# Patient Record
Sex: Male | Born: 1990 | Race: Black or African American | Hispanic: No | Marital: Single | Smoking: Current every day smoker
Health system: Southern US, Community
[De-identification: ages and names within clinical notes are randomized; demographics above are authoritative.]

---
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
## 2012-05-25 ENCOUNTER — Emergency Department (HOSPITAL_COMMUNITY)
Admission: EM | Admit: 2012-05-25 | Discharge: 2012-05-25 | Disposition: A | Discharge status: Home / Self Care | Payer: No Typology Code available for payment source | Attending: Emergency Medicine | Admitting: Emergency Medicine

## 2012-05-25 ENCOUNTER — Encounter (HOSPITAL_COMMUNITY): Payer: Self-pay

## 2012-05-25 DIAGNOSIS — Z87891 Personal history of nicotine dependence: Secondary | ICD-10-CM | POA: Insufficient documentation

## 2012-05-25 DIAGNOSIS — Z79899 Other long term (current) drug therapy: Secondary | ICD-10-CM | POA: Insufficient documentation

## 2012-05-25 DIAGNOSIS — Y9389 Activity, other specified: Secondary | ICD-10-CM | POA: Insufficient documentation

## 2012-05-25 DIAGNOSIS — S79929A Unspecified injury of unspecified thigh, initial encounter: Secondary | ICD-10-CM | POA: Insufficient documentation

## 2012-05-25 DIAGNOSIS — S79919A Unspecified injury of unspecified hip, initial encounter: Secondary | ICD-10-CM | POA: Insufficient documentation

## 2012-05-25 DIAGNOSIS — S0993XA Unspecified injury of face, initial encounter: Secondary | ICD-10-CM | POA: Insufficient documentation

## 2012-05-25 DIAGNOSIS — S199XXA Unspecified injury of neck, initial encounter: Secondary | ICD-10-CM | POA: Insufficient documentation

## 2012-05-25 MED ORDER — TRAMADOL HCL 50 MG PO TABS
50.0000 mg | ORAL_TABLET | Freq: Once | ORAL | Status: AC
  Administered 2012-05-25: 50 mg via ORAL
  Filled 2012-05-25: qty 1

## 2012-05-25 MED ORDER — IBUPROFEN 800 MG PO TABS
800.0000 mg | ORAL_TABLET | Freq: Once | ORAL | Status: AC
  Administered 2012-05-25: 800 mg via ORAL
  Filled 2012-05-25: qty 1

## 2012-05-25 MED ORDER — TRAMADOL HCL 50 MG PO TABS: 50.0000 mg | ORAL_TABLET | Freq: Four times a day (QID) | ORAL | Status: AC | PRN

## 2012-05-25 MED ORDER — DIAZEPAM 5 MG PO TABS
5.0000 mg | ORAL_TABLET | Freq: Once | ORAL | Status: DC
  Filled 2012-05-25: qty 1

## 2012-05-25 MED ORDER — DIAZEPAM 5 MG PO TABS: 5.0000 mg | ORAL_TABLET | Freq: Two times a day (BID) | ORAL | Status: AC

## 2012-05-25 MED ORDER — IBUPROFEN 800 MG PO TABS: 800.0000 mg | ORAL_TABLET | Freq: Three times a day (TID) | ORAL | Status: AC
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
## 2013-06-06 ENCOUNTER — Emergency Department: Payer: Self-pay | Admitting: Emergency Medicine

## 2018-12-02 ENCOUNTER — Other Ambulatory Visit: Payer: Self-pay

## 2018-12-02 ENCOUNTER — Encounter (HOSPITAL_COMMUNITY): Payer: Self-pay | Admitting: Emergency Medicine

## 2018-12-02 ENCOUNTER — Emergency Department (HOSPITAL_COMMUNITY): Payer: 59

## 2018-12-02 ENCOUNTER — Emergency Department (HOSPITAL_COMMUNITY)
Admission: EM | Admit: 2018-12-02 | Discharge: 2018-12-02 | Disposition: A | Discharge status: Home / Self Care | Payer: 59 | Attending: Emergency Medicine | Admitting: Emergency Medicine

## 2018-12-02 DIAGNOSIS — F17210 Nicotine dependence, cigarettes, uncomplicated: Secondary | ICD-10-CM | POA: Diagnosis not present

## 2018-12-02 DIAGNOSIS — Y92410 Unspecified street and highway as the place of occurrence of the external cause: Secondary | ICD-10-CM | POA: Insufficient documentation

## 2018-12-02 DIAGNOSIS — R51 Headache: Secondary | ICD-10-CM | POA: Insufficient documentation

## 2018-12-02 DIAGNOSIS — Y998 Other external cause status: Secondary | ICD-10-CM | POA: Insufficient documentation

## 2018-12-02 DIAGNOSIS — M545 Low back pain: Secondary | ICD-10-CM | POA: Diagnosis not present

## 2018-12-02 DIAGNOSIS — Y9389 Activity, other specified: Secondary | ICD-10-CM | POA: Diagnosis not present

## 2018-12-02 DIAGNOSIS — M542 Cervicalgia: Secondary | ICD-10-CM | POA: Insufficient documentation

## 2018-12-02 DIAGNOSIS — F17290 Nicotine dependence, other tobacco product, uncomplicated: Secondary | ICD-10-CM | POA: Insufficient documentation

## 2018-12-02 MED ORDER — METHOCARBAMOL 500 MG PO TABS: 500.0000 mg | ORAL_TABLET | Freq: Two times a day (BID) | ORAL | 0 refills | Status: AC

## 2018-12-02 MED ORDER — DIPHENHYDRAMINE HCL 25 MG PO CAPS
25.0000 mg | ORAL_CAPSULE | Freq: Once | ORAL | Status: AC
  Administered 2018-12-02: 25 mg via ORAL
  Filled 2018-12-02: qty 1

## 2018-12-02 MED ORDER — NAPROXEN 500 MG PO TABS: 500.0000 mg | ORAL_TABLET | Freq: Two times a day (BID) | ORAL | 0 refills | Status: AC

## 2018-12-02 MED ORDER — METOCLOPRAMIDE HCL 10 MG PO TABS
10.0000 mg | ORAL_TABLET | Freq: Once | ORAL | Status: AC
  Administered 2018-12-02: 10 mg via ORAL
  Filled 2018-12-02: qty 1
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
## 2018-12-02 NOTE — ED Notes (Signed)
Patient transported to X-ray 
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
## 2018-12-02 NOTE — Discharge Instructions (Signed)
I have prescribed muscle relaxers for your pain, please do not drink or drive while taking this medications as they can make you drowsy.  Please follow-up with PCP in 1 week for reevaluation of your symptoms.  You experience any bowel or bladder incontinence, fever, worsening in your symptoms please return to the ED. ° °

## 2020-03-28 ENCOUNTER — Emergency Department (HOSPITAL_COMMUNITY)
Admission: EM | Admit: 2020-03-28 | Discharge: 2020-03-28 | Disposition: A | Discharge status: Home / Self Care | Payer: Commercial Managed Care - PPO | Attending: Emergency Medicine | Admitting: Emergency Medicine

## 2020-03-28 ENCOUNTER — Other Ambulatory Visit: Payer: Self-pay

## 2020-03-28 ENCOUNTER — Emergency Department (HOSPITAL_COMMUNITY): Payer: Commercial Managed Care - PPO

## 2020-03-28 ENCOUNTER — Encounter (HOSPITAL_COMMUNITY): Payer: Self-pay

## 2020-03-28 DIAGNOSIS — R1030 Lower abdominal pain, unspecified: Secondary | ICD-10-CM | POA: Diagnosis present

## 2020-03-28 DIAGNOSIS — F17290 Nicotine dependence, other tobacco product, uncomplicated: Secondary | ICD-10-CM | POA: Insufficient documentation

## 2020-03-28 DIAGNOSIS — K611 Rectal abscess: Secondary | ICD-10-CM | POA: Diagnosis not present

## 2020-03-28 LAB — I-STAT CHEM 8, ED
BUN: 12 mg/dL (ref 6–20)
Calcium, Ion: 1.24 mmol/L (ref 1.15–1.40)
Chloride: 102 mmol/L (ref 98–111)
Creatinine, Ser: 0.8 mg/dL (ref 0.61–1.24)
Glucose, Bld: 92 mg/dL (ref 70–99)
HCT: 49 % (ref 39.0–52.0)
Hemoglobin: 16.7 g/dL (ref 13.0–17.0)
Potassium: 4.3 mmol/L (ref 3.5–5.1)
Sodium: 140 mmol/L (ref 135–145)
TCO2: 27 mmol/L (ref 22–32)

## 2020-03-28 MED ORDER — IOHEXOL 300 MG/ML  SOLN
80.0000 mL | Freq: Once | INTRAMUSCULAR | Status: AC | PRN
  Administered 2020-03-28: 80 mL via INTRAVENOUS

## 2020-03-28 MED ORDER — SODIUM CHLORIDE (PF) 0.9 % IJ SOLN
INTRAMUSCULAR | Status: AC
  Filled 2020-03-28: qty 50

## 2020-03-28 MED ORDER — LIDOCAINE HCL 1 % IJ SOLN
INTRAMUSCULAR | Status: AC
  Filled 2020-03-28: qty 20

## 2020-03-28 MED ORDER — OXYCODONE-ACETAMINOPHEN 5-325 MG PO TABS
1.0000 | ORAL_TABLET | Freq: Once | ORAL | Status: AC
  Administered 2020-03-28: 1 via ORAL
  Filled 2020-03-28: qty 1

## 2020-03-28 MED ORDER — DOXYCYCLINE HYCLATE 100 MG PO CAPS: 100.0000 mg | ORAL_CAPSULE | Freq: Two times a day (BID) | ORAL | 0 refills | Status: AC

## 2020-03-28 NOTE — ED Provider Notes (Signed)
[facility]-EMERGENCY DEPT Provider Note   CSN: [id number] Arrival date & time: 03/28/20  1017     History Chief Complaint  Patient presents with  . Recurrent Skin Infections    Gary Kelley is a 29 y.o. male with no past medical history that presents emergency department today for abscess on his buttock.  Patient states that he started having pain in his intergluteal cleft on Saturday, has been worsening.  States that he did have boil on his inner thigh 4 months ago, was given antibiotics, and I&D and patient states that he felt fine.  States that he completed course of antibiotics.  Denies any fever, chills, nausea, vomiting.  Area has not been draining.  States that he is generally healthy, not immunocompromised.  Has never had pilonidal abscess before.  No history of Crohn's disease.  Patient states that he is unable to sit due to the pain, has not been taking anything for this.  Has been trying warm compresses without much relief.  Denies any straining, blood per rectum, constipation or blood in the stool.  Has never had an abscess in this area.  HPI     History reviewed. No pertinent past medical history.  There are no problems to display for this patient.   History reviewed. No pertinent surgical history.     No family history on file.  Social History   Tobacco Use  . Smoking status: Current Every Day Smoker    Packs/day: 1.00    Types: Pipe, Cigars  . Smokeless tobacco: Never Used  Substance Use Topics  . Alcohol use: Yes    Comment: socially  . Drug use: No    Home Medications Prior to Admission medications   Medication Sig Start Date End Date Taking? Authorizing Provider  diazepam (VALIUM) 5 MG tablet Take 1 tablet (5 mg total) by mouth 2 (two) times daily. 05/25/12   Gerhard Munch, MD  doxycycline (VIBRAMYCIN) 100 MG capsule Take 1 capsule (100 mg total) by mouth 2 (two) times daily for 7 days. 03/28/20 04/04/20  Farrel Gordon,  PA-C  ibuprofen (ADVIL,MOTRIN) 200 MG tablet Take 200 mg by mouth every 6 (six) hours as needed. Pain    [provider]  ibuprofen (ADVIL,MOTRIN) 800 MG tablet Take 1 tablet (800 mg total) by mouth 3 (three) times daily. 05/25/12   Gerhard Munch, MD  traMADol (ULTRAM) 50 MG tablet Take 1 tablet (50 mg total) by mouth every 6 (six) hours as needed for pain. 05/25/12   Gerhard Munch, MD    Allergies    Patient has no known allergies.  Review of Systems   Review of Systems  Constitutional: Negative for diaphoresis, fatigue and fever.  Eyes: Negative for visual disturbance.  Respiratory: Negative for shortness of breath.   Cardiovascular: Negative for chest pain.  Gastrointestinal: Negative for nausea and vomiting.  Genitourinary: Negative for decreased urine volume, discharge, flank pain, frequency, genital sores, hematuria, penile pain, penile swelling, scrotal swelling and testicular pain.  Musculoskeletal: Negative for back pain and myalgias.  Skin: Positive for wound. Negative for color change, pallor and rash.  Neurological: Negative for syncope, weakness, light-headedness, numbness and headaches.  Psychiatric/Behavioral: Negative for behavioral problems and confusion.    Physical Exam Updated Vital Signs BP 120/82   Pulse 70   Temp 98.7 F (37.1 C) (Oral)   Resp 18   Ht 5\' 6"  (1.676 m)   Wt 59 kg   SpO2 99%   BMI 20.98 kg/m  Physical Exam Constitutional:      General: He is not in acute distress.    Appearance: Normal appearance. He is not ill-appearing, toxic-appearing or diaphoretic.  Cardiovascular:     Rate and Rhythm: Normal rate.     Pulses: Normal pulses.  Pulmonary:     Effort: Pulmonary effort is normal.     Breath sounds: Normal breath sounds.  Genitourinary:      Comments: Chaperone present.  Patient with tenderness to intergluteal cleft, is slightly more on the left side than midline.  Fluctuance noted.  Area is warm and erythematous.  Is  not actively draining. Musculoskeletal:        General: Normal range of motion.  Skin:    General: Skin is warm and dry.     Capillary Refill: Capillary refill takes less than 2 seconds.  Neurological:     General: No focal deficit present.     Mental Status: He is alert and oriented to person, place, and time.  Psychiatric:        Mood and Affect: Mood normal.        Behavior: Behavior normal.        Thought Content: Thought content normal.     ED Results / Procedures / Treatments   Labs (all labs ordered are listed, but only abnormal results are displayed) Labs Reviewed  I-STAT CHEM 8, ED    EKG None  Radiology CT PELVIS W CONTRAST  Result Date: 03/28/2020 CLINICAL DATA:  Rectal or anal abscess. EXAM: CT PELVIS WITH CONTRAST TECHNIQUE: Multidetector CT imaging of the pelvis was performed using the standard protocol following the bolus administration of intravenous contrast. CONTRAST:  74mL OMNIPAQUE IOHEXOL 300 MG/ML  SOLN COMPARISON:  None. FINDINGS: Urinary Tract:  No abnormality. Bowel:  No abnormality. Vascular/Lymphatic: No abnormality. Reproductive:  No abnormality. Other: Soft tissue inflammation with a central low-density component measuring 2.7 x 1.8 x 3.0 cm in the subcutaneous fat of the left buttock adjacent to the gluteal fold. No sign of perirectal origin or extension. Musculoskeletal: Otherwise negative IMPRESSION: Soft tissue inflammation with a central low-density component measuring 2.7 x 1.8 x 3.0 cm in the subcutaneous fat of the left buttock adjacent to the gluteal fold. This is consistent with a soft tissue abscess. No sign of perirectal origin or extension. Electronically Signed   By: Paulina Fusi M.D.   On: 03/28/2020 14:06    Procedures .Marland KitchenIncision and Drainage  Date/Time: 03/28/2020 3:24 PM Performed by: Farrel Gordon, PA-C Authorized by: Farrel Gordon, PA-C   Consent:    Consent obtained:  Verbal   Consent given by:  Patient   Risks discussed:   Bleeding, incomplete drainage, pain and damage to other organs   Alternatives discussed:  No treatment Universal protocol:    Procedure explained and questions answered to patient or proxy's satisfaction: yes     Relevant documents present and verified: yes     Test results available and properly labeled: yes     Imaging studies available: yes     Required blood products, implants, devices, and special equipment available: yes     Site/side marked: yes     Immediately prior to procedure a time out was called: yes     Patient identity confirmed:  Verbally with patient Location:    Type:  Abscess   Size:  2x 3cm    Location:  Anogenital   Anogenital location:  Perirectal Pre-procedure details:    Skin preparation:  Betadine Anesthesia (see MAR  for exact dosages):    Anesthesia method:  Local infiltration   Local anesthetic:  Lidocaine 1% w/o epi Procedure type:    Complexity:  Complex Procedure details:    Incision types:  Single straight   Incision depth:  Subcutaneous   Scalpel blade:  11   Wound management:  Probed and deloculated, irrigated with saline and extensive cleaning   Drainage:  Purulent   Drainage amount:  Moderate   Wound treatment:  Wound left open   Packing materials:  1/4 in gauze and 1/2 in gauze Post-procedure details:    Patient tolerance of procedure:  Tolerated well, no immediate complications   (including critical care time)  Medications Ordered in ED Medications  sodium chloride (PF) 0.9 % injection (has no administration in time range)  lidocaine (XYLOCAINE) 1 % (with pres) injection (has no administration in time range)  oxyCODONE-acetaminophen (PERCOCET/ROXICET) 5-325 MG per tablet 1 tablet (1 tablet Oral Given 03/28/20 1246)  iohexol (OMNIPAQUE) 300 MG/ML solution 80 mL (80 mLs Intravenous Contrast Given 03/28/20 1348)  oxyCODONE-acetaminophen (PERCOCET/ROXICET) 5-325 MG per tablet 1 tablet (1 tablet Oral Given 03/28/20 1444)    ED Course  I  have reviewed the triage vital signs and the nursing notes.  Pertinent labs & imaging results that were available during my care of the patient were reviewed by me and considered in my medical decision making (see chart for details).    MDM Rules/Calculators/A&P                          Gary Kelley is a 29 y.o. male with reported past medical history that presents emergency department today for abscess on his buttock.  Patient appears well, vitals normal, afebrile.  Will obtain CT at this time to acess for deep space infection/fistula.  Pain controlled with Percocet.  We will plan to I&D once CT is obtained.  CT scan without any evidence of fistula or deep space infection.  Superficial abscess.  Drained and packed without any acute complications, will place on antibiotics and have patient follow-up in the next 2 days.  Patient agreeable.  Doubt need for further emergent work up at this time. I explained the diagnosis and have given explicit precautions to return to the ER including for any other new or worsening symptoms. The patient understands and accepts the medical plan as it's been dictated and I have answered their questions. Discharge instructions concerning home care and prescriptions have been given. The patient is STABLE and is discharged to home in good condition.   Final Clinical Impression(s) / ED Diagnoses Final diagnoses:  Perirectal abscess    Rx / DC Orders ED Discharge Orders         Ordered    doxycycline (VIBRAMYCIN) 100 MG capsule  2 times daily        03/28/20 1441           Farrel Gordon, PA-C 03/28/20 1529    Gwyneth Sprout, MD 03/30/20 1324

## 2020-03-28 NOTE — ED Triage Notes (Signed)
Pt reports boil on bottom that he noticed on Friday. Patient using warm compresses at home with no relief.    A/ox4 8/10 pain

## 2020-03-28 NOTE — Discharge Instructions (Addendum)
You are seen today for abscess, I want you to come back to the emergency department or urgent care to get your wound rechecked in 2 days.  Please keep the packing in, this will fall out by itself.  Please take the antibiotics as directed, you can use Tylenol as directed on the bottle for pain.  Please use the attached instructions.  Please come back to the emergency department for any new worsening concerning symptoms.

## 2020-03-28 NOTE — ED Notes (Signed)
An After Visit Summary was printed and given to the patient. Discharge instructions given and no further questions at this time.  
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
